# Patient Record
Sex: Male | Born: 1995 | Hispanic: No | Marital: Single | Smoking: Never smoker
Health system: Southern US, Community
[De-identification: ages and names within clinical notes are randomized; demographics above are authoritative.]

---
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
## 2016-04-17 ENCOUNTER — Encounter (HOSPITAL_COMMUNITY): Payer: Self-pay | Admitting: *Deleted

## 2016-04-17 ENCOUNTER — Emergency Department (HOSPITAL_COMMUNITY)
Admission: EM | Admit: 2016-04-17 | Discharge: 2016-04-17 | Disposition: A | Discharge status: Home / Self Care | Payer: 59 | Attending: Emergency Medicine | Admitting: Emergency Medicine

## 2016-04-17 ENCOUNTER — Emergency Department (HOSPITAL_COMMUNITY): Payer: 59

## 2016-04-17 DIAGNOSIS — S39012A Strain of muscle, fascia and tendon of lower back, initial encounter: Secondary | ICD-10-CM | POA: Diagnosis not present

## 2016-04-17 DIAGNOSIS — Y92410 Unspecified street and highway as the place of occurrence of the external cause: Secondary | ICD-10-CM | POA: Diagnosis not present

## 2016-04-17 DIAGNOSIS — S161XXA Strain of muscle, fascia and tendon at neck level, initial encounter: Secondary | ICD-10-CM | POA: Insufficient documentation

## 2016-04-17 DIAGNOSIS — Y939 Activity, unspecified: Secondary | ICD-10-CM | POA: Diagnosis not present

## 2016-04-17 DIAGNOSIS — Y999 Unspecified external cause status: Secondary | ICD-10-CM | POA: Insufficient documentation

## 2016-04-17 DIAGNOSIS — S3992XA Unspecified injury of lower back, initial encounter: Secondary | ICD-10-CM | POA: Diagnosis present

## 2016-04-17 MED ORDER — METHOCARBAMOL 500 MG PO TABS: 500.0000 mg | ORAL_TABLET | Freq: Two times a day (BID) | ORAL | 0 refills | Status: DC

## 2016-04-17 MED ORDER — NAPROXEN 500 MG PO TABS: 500.0000 mg | ORAL_TABLET | Freq: Two times a day (BID) | ORAL | 0 refills | Status: DC

## 2016-04-17 MED ORDER — IBUPROFEN 400 MG PO TABS
800.0000 mg | ORAL_TABLET | Freq: Once | ORAL | Status: AC
  Administered 2016-04-17: 800 mg via ORAL
  Filled 2016-04-17: qty 2

## 2016-04-17 NOTE — Discharge Instructions (Signed)

## 2016-04-17 NOTE — ED Notes (Signed)
Loss of bladder control
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
## 2016-04-17 NOTE — ED Notes (Signed)
Patient transported to X-ray 
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
## 2021-10-12 ENCOUNTER — Ambulatory Visit: Payer: 59 | Admitting: Podiatry

## 2022-05-25 DIAGNOSIS — B349 Viral infection, unspecified: Secondary | ICD-10-CM | POA: Diagnosis not present

## 2022-05-25 DIAGNOSIS — J029 Acute pharyngitis, unspecified: Secondary | ICD-10-CM | POA: Diagnosis not present

## 2022-06-07 DIAGNOSIS — R051 Acute cough: Secondary | ICD-10-CM | POA: Diagnosis not present

## 2022-06-07 DIAGNOSIS — J029 Acute pharyngitis, unspecified: Secondary | ICD-10-CM | POA: Diagnosis not present

## 2022-06-07 DIAGNOSIS — J069 Acute upper respiratory infection, unspecified: Secondary | ICD-10-CM | POA: Diagnosis not present

## 2022-06-07 DIAGNOSIS — U071 COVID-19: Secondary | ICD-10-CM | POA: Diagnosis not present

## 2022-09-23 ENCOUNTER — Ambulatory Visit: Payer: 59 | Admitting: Podiatry

## 2022-09-30 ENCOUNTER — Encounter: Payer: Self-pay | Admitting: Podiatry

## 2022-09-30 ENCOUNTER — Ambulatory Visit (INDEPENDENT_AMBULATORY_CARE_PROVIDER_SITE_OTHER): Payer: 59

## 2022-09-30 ENCOUNTER — Ambulatory Visit (INDEPENDENT_AMBULATORY_CARE_PROVIDER_SITE_OTHER): Payer: 59 | Admitting: Podiatry

## 2022-09-30 DIAGNOSIS — M79671 Pain in right foot: Secondary | ICD-10-CM

## 2022-09-30 DIAGNOSIS — M79672 Pain in left foot: Secondary | ICD-10-CM

## 2022-09-30 DIAGNOSIS — M2142 Flat foot [pes planus] (acquired), left foot: Secondary | ICD-10-CM

## 2022-09-30 DIAGNOSIS — M7671 Peroneal tendinitis, right leg: Secondary | ICD-10-CM

## 2022-09-30 DIAGNOSIS — M76821 Posterior tibial tendinitis, right leg: Secondary | ICD-10-CM

## 2022-09-30 DIAGNOSIS — M2141 Flat foot [pes planus] (acquired), right foot: Secondary | ICD-10-CM | POA: Diagnosis not present

## 2022-09-30 MED ORDER — MELOXICAM 15 MG PO TABS: 15.0000 mg | ORAL_TABLET | Freq: Every day | ORAL | 0 refills | Status: DC

## 2022-09-30 NOTE — Progress Notes (Signed)
  Subjective:  Patient ID: Maxwell Graham, male    DOB: 1995/06/14,   MRN: [id number]  Chief Complaint  Patient presents with   Foot Pain    Bilateral foot pain. Right foot is worse than left. , Patient states there is no pain really in the left foot. Pain is mostly at the top of his foot and on the right side he states he has experienced some swelling.     27 y.o. male presents for concern of bilateral foot pain that has been going on for a while. Relates most of the pain is in his right foot. Most pain is on the top of the foot and the outside of the foot. Relates he works long hours on his feet.  . Denies any other pedal complaints. Denies n/v/f/c.   History reviewed. No pertinent past medical history.  Objective:  Physical Exam: Vascular: DP/PT pulses 2/4 bilateral. CFT <3 seconds. Normal hair growth on digits. No edema.  Skin. No lacerations or abrasions bilateral feet.  Musculoskeletal: MMT 5/5 bilateral lower extremities in DF, PF, Inversion and Eversion. Deceased ROM in DF of ankle joint.  Tender along Pt insertion and peroneal tendons. Tender to the dorsum of the foot around TN joint. No pain with ROM. Pain with inversion eversion and trouble with DF.  Neurological: Sensation intact to light touch.   Assessment:   1. Bilateral pes planus   2. 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 Peroneal tendonitis, right      Plan:  Patient was evaluated and treated and all questions answered. -Xrays reviewed. No acute fracutres or dislocations noted. There is pes planus noted bilateral with collapse of midfoot and mild degenerative changes noted throughout midfoot.  -Discussed treatement options; discussed pes planus deformity;conservative and  surgical  -Discussed inserts and patient will look into.  Discussed PTTD and peroneal tendonitsi diagnosis and treatment options with patient. Stretching exercises discussed and handout dispensed. Prescription for  meloxicam provided. Tri-Lock ankle brace dispensed. Discussed if there is no improvement PT/MRI/injection may be an option. Patient to return to clinic as needed    Louann Sjogren, DPM

## 2022-09-30 NOTE — Patient Instructions (Signed)
Posterior Tibial Tendinitis Rehab Ask your health care provider which exercises are safe for you. Do exercises exactly as told by your health care provider and adjust them as directed. It is normal to feel mild stretching, pulling, tightness, or discomfort as you do these exercises. Stop right away if you feel sudden pain or your pain gets worse. Do not begin these exercises until told by your health care provider. Stretching and range-of-motion exercises These exercises warm up your muscles and joints and improve the movement and flexibility in your ankle and foot. These exercises may also help to relieve pain. Standing wall calf stretch, knee straight  Stand with your hands against a wall. Extend your left / right leg behind you, and bend your front knee slightly. If directed, place a folded washcloth under the arch of your foot for support. Point the toes of your back foot slightly inward. Keeping your heels on the floor and your back knee straight, shift your weight toward the wall. Do not allow your back to arch. You should feel a gentle stretch in your upper left / right calf. Hold this position for __________ seconds. Repeat __________ times. Complete this exercise __________ times a day. Standing wall calf stretch, knee bent Stand with your hands against a wall. Extend your left / right leg behind you, and bend your front knee slightly. If directed, place a folded washcloth under the arch of your foot for support. Point the toes of your back foot slightly inward. Unlock your back knee so it is bent. Keep your heels on the floor. You should feel a gentle stretch deep in your lower left / right calf. Hold this position for __________ seconds. Repeat __________ times. Complete this exercise __________ times a day. Strengthening exercises These exercises build strength and endurance in your ankle and foot. Endurance is the ability to use your muscles for a long time, even after they get  tired. Ankle inversion with band Secure one end of a rubber exercise band or tubing to a fixed object, such as a table leg or a pole, that will stay still when the band is pulled. Loop the other end of the band around the middle of your left / right foot. Sit on the floor facing the object with your left / right leg extended. The band or tube should be slightly tense when your foot is relaxed. Leading with your big toe, slowly bring your left / right foot and ankle inward, toward your other foot (inversion). Hold this position for __________ seconds. Slowly return your foot to the starting position. Repeat __________ times. Complete this exercise __________ times a day. Towel curls  Sit in a chair on a non-carpeted surface, and put your feet on the floor. Place a towel in front of your feet. If told by your health care provider, add a __________ weight to the end of the towel. Keeping your heel on the floor, put your left / right foot on the towel. Pull the towel toward you by grabbing the towel with your toes and curling them under. Keep your heel on the floor while you do this. Let your toes relax. Grab the towel with your toes again. Keep going until the towel is completely underneath your foot. Repeat __________ times. Complete this exercise __________ times a day. Balance exercise This exercise improves or maintains your balance. Balance is important in preventing falls. Single leg stand Without wearing shoes, stand near a railing or in a doorway. You may hold   on to the railing or door frame as needed for balance. Stand on your left / right foot. Keep your big toe down on the floor and try to keep your arch lifted. If balancing in this position is too easy, try the exercise with your eyes closed or while standing on a pillow. Hold this position for __________ seconds. Repeat __________ times. Complete this exercise __________ times a day. This information is not intended to replace  advice given to you by your health care provider. Make sure you discuss any questions you have with your health care provider. Document Revised: 09/11/2018 Document Reviewed: 07/18/2018 [employer] Patient Education  2023 [employer]  

## 2022-12-12 ENCOUNTER — Ambulatory Visit
Admission: EM | Admit: 2022-12-12 | Discharge: 2022-12-12 | Disposition: A | Discharge status: Home / Self Care | Payer: BC Managed Care – PPO | Attending: Urgent Care | Admitting: Urgent Care

## 2022-12-12 DIAGNOSIS — R0789 Other chest pain: Secondary | ICD-10-CM | POA: Diagnosis not present

## 2022-12-12 DIAGNOSIS — K219 Gastro-esophageal reflux disease without esophagitis: Secondary | ICD-10-CM | POA: Diagnosis not present

## 2022-12-12 MED ORDER — LIDOCAINE VISCOUS HCL 2 % MT SOLN: 15.0000 mL | Freq: Once | OROMUCOSAL | Status: DC

## 2022-12-12 MED ORDER — PANTOPRAZOLE SODIUM 40 MG PO TBEC: 40.0000 mg | DELAYED_RELEASE_TABLET | Freq: Every day | ORAL | 0 refills | Status: AC

## 2022-12-12 MED ORDER — ALUM & MAG HYDROXIDE-SIMETH 200-200-20 MG/5ML PO SUSP: 30.0000 mL | Freq: Once | ORAL | Status: DC

## 2022-12-12 NOTE — ED Provider Notes (Signed)
[facility]    CSN: [id number] Arrival date & time: 12/12/22  0935      History   Chief Complaint Chief Complaint  Patient presents with   Chest Pain    HPI Maxwell Graham is a 27 y.o. male.    27 year old male with no personal known past medical history presents today due to concerns of chest discomfort that started around 6 AM this morning.  Patient reports eating late at night, and thought that this was possibly GERD related.  However he also states that during the episodes of the chest pain he felt that his heart was beating fast and hard.  He was short of breath at the time as well.  He states that pushing on his chest wall seems to reproduce his symptoms somewhat, he also states that leaning forward causes the pain.  Sitting upright or laying back helps alleviate the discomfort.  He denies a fever or cough.  He does not smoke nicotine products.  He has never been diagnosed with hypertension in the past and takes no prescription medications.  Patient denies any diaphoresis or nausea. He did take a 81mg  aspirin prior to eval today and feels this was mildly effective. Pt denies any recent exercise injury or heavy lifting/ straining. 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    Chest Pain   History reviewed. No pertinent past medical history.  There are no problems to display for this patient.   History reviewed. No pertinent surgical history.     Home Medications    Prior to Admission medications   Medication Sig Start Date End Date Taking? Authorizing Provider  pantoprazole (PROTONIX) 40 MG tablet Take 1 tablet (40 mg total) by mouth daily. 12/12/22  Yes Anniebell Bedore, Jodelle Gross, PA    Family History Family History  Problem Relation Age of Onset   Hypertension Paternal Grandmother     Social History Social History   Tobacco Use   Smoking status: Never   Smokeless tobacco: Never  Vaping Use   Vaping status: Never Used  Substance Use  Topics   Alcohol use: Yes    Comment: occ   Drug use: Yes    Types: Marijuana     Allergies   Patient has no known allergies.   Review of Systems Review of Systems  Cardiovascular:  Positive for chest pain.  As per HPI   Physical Exam Triage Vital Signs ED Triage Vitals  Encounter Vitals Group     BP 12/12/22 0953 (!) 147/92     Systolic BP Percentile --      Diastolic BP Percentile --      Pulse Rate 12/12/22 0953 96     Resp 12/12/22 0953 18     Temp 12/12/22 0953 98.2 F (36.8 C)     Temp Source 12/12/22 0953 Oral     SpO2 12/12/22 0953 96 %     Weight --      Height --      Head Circumference --      Peak Flow --      Pain Score 12/12/22 1000 0     Pain Loc --      Pain Education --      Exclude from Growth Chart --    No data found.  Updated Vital Signs BP (!) 147/92 (BP Location: Left Arm)   Pulse 96   Temp 98.2 F (36.8 C) (Oral)   Resp 18  SpO2 96%   Visual Acuity Right Eye Distance:   Left Eye Distance:   Bilateral Distance:    Right Eye Near:   Left Eye Near:    Bilateral Near:     Physical Exam Vitals and nursing note reviewed.  Constitutional:      General: He is not in acute distress.    Appearance: He is well-developed. He is obese. He is not ill-appearing, toxic-appearing or diaphoretic.  HENT:     Head: Normocephalic and atraumatic.  Eyes:     Extraocular Movements: Extraocular movements intact.     Conjunctiva/sclera: Conjunctivae normal.     Pupils: Pupils are equal, round, and reactive to light.  Cardiovascular:     Rate and Rhythm: Normal rate and regular rhythm.     Heart sounds: Normal heart sounds. Heart sounds not distant. No murmur heard. Pulmonary:     Effort: Pulmonary effort is normal. No tachypnea, accessory muscle usage or respiratory distress.     Breath sounds: Normal breath sounds. No decreased breath sounds, wheezing, rhonchi or rales.  Chest:     Chest wall: No mass, deformity, tenderness, crepitus or  edema. There is no dullness to percussion.  Abdominal:     General: Bowel sounds are normal.     Palpations: Abdomen is soft. There is no hepatomegaly, splenomegaly or mass.     Tenderness: There is no abdominal tenderness.  Musculoskeletal:        General: No swelling.     Cervical back: Normal range of motion and neck supple.     Right lower leg: No edema.     Left lower leg: No edema.  Skin:    General: Skin is warm and dry.     Capillary Refill: Capillary refill takes less than 2 seconds.     Coloration: Skin is not cyanotic.     Findings: No ecchymosis or erythema.     Nails: There is no clubbing.  Neurological:     General: No focal deficit present.     Mental Status: He is alert and oriented to person, place, and time.     Cranial Nerves: No cranial nerve deficit.     Motor: No weakness.  Psychiatric:        Mood and Affect: Mood normal.      UC Treatments / Results  Labs (all labs ordered are listed, but only abnormal results are displayed) Labs Reviewed - No data to display  EKG NSR, rightward axis, 81bpm, PR 198  Radiology No results found.  Procedures Procedures (including critical care time)  Medications Ordered in UC Medications - No data to display  Initial Impression / Assessment and Plan / UC Course  I have reviewed the triage vital signs and the nursing notes.  Pertinent labs & imaging results that were available during my care of the patient were reviewed by me and considered in my medical decision making (see chart for details).     Other chest pain - EKG appears benign. BP elevated, pt without dx of HTN. 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 Pt is not in any current distress.  GERD - pt with sx worse after eating or laying flat. Did respond to PPI in the past. Will start protonix daily. Elevated HOB, do not eat late at night. GI cocktail was ordered in office, pt refused administration of medication. Elevated BP - discussed monitoring this at home and establishing  with a primary care provider to ensure this is not a chronic reading.  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   Final Clinical Impressions(s) / UC Diagnoses   Final diagnoses:  Other chest pain  Gastroesophageal reflux disease, unspecified whether esophagitis present     Discharge Instructions      Your EKG does not show any concerning findings. Your blood pressure is elevated, which needs to be monitored at home. Please check your blood pressure routinely, and establish care with a PCP to further assess.  I also suspect you may have obstructive sleep apnea, which should be addressed with your primary.  Regarding your chest discomfort, I suspect this to be secondary to reflux or esophageal spasm. Please limit late-night eating.  Consider elevating the head of your bed while sleeping. Take the medication prescribed today 30 minutes before breakfast every morning.  If your chest pain changes in nature, worsens, or does not resolve, please return for recheck or head to the ER.     ED Prescriptions     Medication Sig Dispense Auth. Provider   pantoprazole (PROTONIX) 40 MG tablet Take 1 tablet (40 mg total) by mouth daily. 30 tablet Quashawn Jewkes L, [place]      PDMP not reviewed this encounter.   Maretta Bees, [place] 12/12/22 2133

## 2022-12-12 NOTE — Discharge Instructions (Addendum)
Your EKG does not show any concerning findings. Your blood pressure is elevated, which needs to be monitored at home. Please check your blood pressure routinely, and establish care with a PCP to further assess.  I also suspect you may have obstructive sleep apnea, which should be addressed with your primary.  Regarding your chest discomfort, I suspect this to be secondary to reflux or esophageal spasm. Please limit late-night eating.  Consider elevating the head of your bed while sleeping. Take the medication prescribed today 30 minutes before breakfast every morning.  If your chest pain changes in nature, worsens, or does not resolve, please return for recheck or head to the ER.

## 2022-12-12 NOTE — ED Notes (Signed)
Pt refused GI cocktail-PA notified

## 2022-12-12 NOTE — ED Triage Notes (Signed)
Pt c/o intermittent chest tightness started 6am-denies at present-also c/o "reflux because I eat too late"-NAD-steady gait
